# Patient Record
Sex: Male | Born: 1955 | Marital: Married | Smoking: Former smoker
Health system: Southern US, Community
[De-identification: ages and names within clinical notes are randomized; demographics above are authoritative.]

## PROBLEM LIST (undated history)

## (undated) DIAGNOSIS — E785 Hyperlipidemia, unspecified: Secondary | ICD-10-CM

## (undated) HISTORY — DX: Hyperlipidemia, unspecified: E78.5

---

## 2012-10-20 ENCOUNTER — Ambulatory Visit (INDEPENDENT_AMBULATORY_CARE_PROVIDER_SITE_OTHER): Payer: Managed Care, Other (non HMO) | Admitting: Internal Medicine

## 2012-10-20 ENCOUNTER — Encounter: Payer: Self-pay | Admitting: Internal Medicine

## 2012-10-20 VITALS — BP 130/90 | HR 74 | Temp 98.2°F | Ht 64.5 in | Wt 151.0 lb

## 2012-10-20 DIAGNOSIS — L509 Urticaria, unspecified: Secondary | ICD-10-CM

## 2012-10-20 DIAGNOSIS — S42309S Unspecified fracture of shaft of humerus, unspecified arm, sequela: Secondary | ICD-10-CM

## 2012-10-20 DIAGNOSIS — S2241XS Multiple fractures of ribs, right side, sequela: Secondary | ICD-10-CM

## 2012-10-20 DIAGNOSIS — IMO0002 Reserved for concepts with insufficient information to code with codable children: Secondary | ICD-10-CM

## 2012-10-20 DIAGNOSIS — S42109A Fracture of unspecified part of scapula, unspecified shoulder, initial encounter for closed fracture: Secondary | ICD-10-CM | POA: Insufficient documentation

## 2012-10-20 DIAGNOSIS — Z0000 Encounter for general adult medical examination without abnormal findings: Secondary | ICD-10-CM

## 2012-10-20 DIAGNOSIS — S2249XA Multiple fractures of ribs, unspecified side, initial encounter for closed fracture: Secondary | ICD-10-CM | POA: Insufficient documentation

## 2012-10-20 DIAGNOSIS — S42101S Fracture of unspecified part of scapula, right shoulder, sequela: Secondary | ICD-10-CM

## 2012-10-20 MED ORDER — KETOCONAZOLE 2 % EX CREA: TOPICAL_CREAM | Freq: Two times a day (BID) | CUTANEOUS | Status: DC

## 2012-10-20 MED ORDER — VITAMIN D 1000 UNITS PO TABS: 1000.0000 [IU] | ORAL_TABLET | Freq: Every day | ORAL | Status: AC

## 2012-10-20 NOTE — Assessment & Plan Note (Signed)
9/14 R x6 ribs Continue with current prescription therapy as reflected on the Med list.

## 2012-10-20 NOTE — Progress Notes (Signed)
  Subjective:    Patient ID: Manuel Hendrix, male    DOB: Sep 03, 1955, 57 y.o.   MRN: [id number]  HPI  New pt Had a fall 3 wks ago (off a 7 ft tall retention wall) in [place] - 6 rib fx and scapula fx. F/u w/Dr Supple - no need for surgery. C/o R CP worse at night.  Review of Systems  Constitutional: Negative for appetite change, fatigue and unexpected weight change.  HENT: Negative for ear pain, nosebleeds, congestion, sore throat, sneezing, trouble swallowing, neck pain and dental problem.   Eyes: Negative for itching and visual disturbance.  Respiratory: Negative for cough, chest tightness, wheezing and stridor.   Cardiovascular: Positive for chest pain. Negative for palpitations and leg swelling.  Gastrointestinal: Negative for nausea, vomiting, diarrhea, blood in stool and abdominal distention.  Genitourinary: Negative for urgency, frequency, hematuria, decreased urine volume and enuresis.  Musculoskeletal: Negative for back pain, joint swelling and gait problem.  Skin: Negative for rash.  Neurological: Negative for dizziness, tremors, speech difficulty, weakness, light-headedness and numbness.  Psychiatric/Behavioral: Negative for suicidal ideas, behavioral problems, confusion, sleep disturbance, self-injury, dysphoric mood and agitation. The patient is not nervous/anxious.        Objective:   Physical Exam  Constitutional: He is oriented to person, place, and time. He appears well-developed. No distress.  NAD  HENT:  Mouth/Throat: Oropharynx is clear and moist.  Eyes: Conjunctivae are normal. Pupils are equal, round, and reactive to light.  Neck: Normal range of motion. No JVD present. No thyromegaly present.  Cardiovascular: Normal rate, regular rhythm, normal heart sounds and intact distal pulses.  Exam reveals no gallop and no friction rub.   No murmur heard. R chest wall is tender  Pulmonary/Chest: Effort normal and breath sounds normal. No respiratory distress. He has  no wheezes. He has no rales. He exhibits no tenderness.  Abdominal: Soft. Bowel sounds are normal. He exhibits no distension and no mass. There is no tenderness. There is no rebound and no guarding.  Musculoskeletal: Normal range of motion. He exhibits no edema and no tenderness.  Lymphadenopathy:    He has no cervical adenopathy.  Neurological: He is alert and oriented to person, place, and time. He has normal reflexes. No cranial nerve deficit. He exhibits normal muscle tone. He displays a negative Romberg sign. Coordination and gait normal.  Skin: Skin is warm and dry. No rash noted.  Psychiatric: He has a normal mood and affect. His behavior is normal. Judgment and thought content normal.          Assessment & Plan:

## 2012-10-20 NOTE — Assessment & Plan Note (Signed)
Claritin qd Hydrocort cream Call if not resolved

## 2012-10-20 NOTE — Assessment & Plan Note (Signed)
9/14 R Dr Rennis Chris No need for surgery

## 2012-12-14 ENCOUNTER — Encounter: Payer: Managed Care, Other (non HMO) | Admitting: Internal Medicine

## 2013-01-04 ENCOUNTER — Telehealth: Payer: Self-pay | Admitting: Internal Medicine

## 2013-01-04 NOTE — Telephone Encounter (Signed)
Pt left. Apparently his wife is a pt and he had asked Dr Rhea Belton to draw labs for him.

## 2013-01-07 ENCOUNTER — Telehealth: Payer: Self-pay | Admitting: Internal Medicine

## 2013-01-07 DIAGNOSIS — Z8619 Personal history of other infectious and parasitic diseases: Secondary | ICD-10-CM

## 2013-01-07 NOTE — Telephone Encounter (Signed)
Dr Dorena Bodo, this pt has called before. You see his wife and he states you told him you would draw labs. Please advise. Thanks.

## 2013-01-10 NOTE — Telephone Encounter (Signed)
H pylori stool Ag.

## 2013-01-11 NOTE — Telephone Encounter (Signed)
Advised patient in Spanish that lab orders were put in.

## 2013-01-11 NOTE — Telephone Encounter (Signed)
Ordered test and one of our staff will inform pt who speaks Spanish.

## 2013-02-04 ENCOUNTER — Other Ambulatory Visit: Payer: PRIVATE HEALTH INSURANCE

## 2013-02-04 DIAGNOSIS — Z8619 Personal history of other infectious and parasitic diseases: Secondary | ICD-10-CM

## 2013-02-05 LAB — HELICOBACTER PYLORI  SPECIAL ANTIGEN: H. PYLORI Antigen: NEGATIVE

## 2013-02-28 ENCOUNTER — Ambulatory Visit: Payer: PRIVATE HEALTH INSURANCE | Admitting: Psychology

## 2013-03-04 ENCOUNTER — Ambulatory Visit: Payer: PRIVATE HEALTH INSURANCE | Admitting: Psychology

## 2013-03-09 ENCOUNTER — Ambulatory Visit (INDEPENDENT_AMBULATORY_CARE_PROVIDER_SITE_OTHER): Payer: Managed Care, Other (non HMO) | Admitting: Psychology

## 2013-03-09 DIAGNOSIS — F4323 Adjustment disorder with mixed anxiety and depressed mood: Secondary | ICD-10-CM

## 2013-03-16 ENCOUNTER — Ambulatory Visit (INDEPENDENT_AMBULATORY_CARE_PROVIDER_SITE_OTHER): Payer: Managed Care, Other (non HMO) | Admitting: Psychology

## 2013-03-16 DIAGNOSIS — F4323 Adjustment disorder with mixed anxiety and depressed mood: Secondary | ICD-10-CM

## 2013-03-23 ENCOUNTER — Ambulatory Visit (INDEPENDENT_AMBULATORY_CARE_PROVIDER_SITE_OTHER): Payer: Managed Care, Other (non HMO) | Admitting: Psychology

## 2013-03-23 DIAGNOSIS — F4323 Adjustment disorder with mixed anxiety and depressed mood: Secondary | ICD-10-CM

## 2013-03-30 ENCOUNTER — Ambulatory Visit (INDEPENDENT_AMBULATORY_CARE_PROVIDER_SITE_OTHER): Payer: Managed Care, Other (non HMO) | Admitting: Psychology

## 2013-03-30 ENCOUNTER — Ambulatory Visit: Payer: Managed Care, Other (non HMO) | Admitting: Psychology

## 2013-03-30 DIAGNOSIS — F4323 Adjustment disorder with mixed anxiety and depressed mood: Secondary | ICD-10-CM

## 2013-04-11 ENCOUNTER — Ambulatory Visit (INDEPENDENT_AMBULATORY_CARE_PROVIDER_SITE_OTHER): Payer: Managed Care, Other (non HMO) | Admitting: Psychology

## 2013-04-11 DIAGNOSIS — F4323 Adjustment disorder with mixed anxiety and depressed mood: Secondary | ICD-10-CM

## 2013-05-04 ENCOUNTER — Ambulatory Visit: Payer: PRIVATE HEALTH INSURANCE | Admitting: Psychology

## 2013-05-13 ENCOUNTER — Ambulatory Visit: Payer: PRIVATE HEALTH INSURANCE | Admitting: Psychology

## 2013-05-16 ENCOUNTER — Ambulatory Visit (INDEPENDENT_AMBULATORY_CARE_PROVIDER_SITE_OTHER): Payer: Managed Care, Other (non HMO) | Admitting: Psychology

## 2013-05-16 DIAGNOSIS — F4323 Adjustment disorder with mixed anxiety and depressed mood: Secondary | ICD-10-CM

## 2013-05-30 ENCOUNTER — Ambulatory Visit (INDEPENDENT_AMBULATORY_CARE_PROVIDER_SITE_OTHER): Payer: PRIVATE HEALTH INSURANCE | Admitting: Psychology

## 2013-05-30 DIAGNOSIS — F4323 Adjustment disorder with mixed anxiety and depressed mood: Secondary | ICD-10-CM

## 2013-06-22 ENCOUNTER — Ambulatory Visit (INDEPENDENT_AMBULATORY_CARE_PROVIDER_SITE_OTHER): Payer: PRIVATE HEALTH INSURANCE

## 2013-06-22 ENCOUNTER — Encounter: Payer: Self-pay | Admitting: Internal Medicine

## 2013-06-22 ENCOUNTER — Ambulatory Visit (INDEPENDENT_AMBULATORY_CARE_PROVIDER_SITE_OTHER): Payer: PRIVATE HEALTH INSURANCE | Admitting: Internal Medicine

## 2013-06-22 ENCOUNTER — Ambulatory Visit (INDEPENDENT_AMBULATORY_CARE_PROVIDER_SITE_OTHER): Payer: PRIVATE HEALTH INSURANCE | Admitting: Psychology

## 2013-06-22 VITALS — BP 140/80 | HR 72 | Temp 98.3°F | Resp 16 | Wt 152.0 lb

## 2013-06-22 DIAGNOSIS — R7309 Other abnormal glucose: Secondary | ICD-10-CM

## 2013-06-22 DIAGNOSIS — Z7721 Contact with and (suspected) exposure to potentially hazardous body fluids: Secondary | ICD-10-CM

## 2013-06-22 DIAGNOSIS — R739 Hyperglycemia, unspecified: Secondary | ICD-10-CM | POA: Insufficient documentation

## 2013-06-22 DIAGNOSIS — Z0000 Encounter for general adult medical examination without abnormal findings: Secondary | ICD-10-CM

## 2013-06-22 DIAGNOSIS — F4323 Adjustment disorder with mixed anxiety and depressed mood: Secondary | ICD-10-CM

## 2013-06-22 DIAGNOSIS — N529 Male erectile dysfunction, unspecified: Secondary | ICD-10-CM

## 2013-06-22 DIAGNOSIS — E785 Hyperlipidemia, unspecified: Secondary | ICD-10-CM | POA: Insufficient documentation

## 2013-06-22 LAB — GLUCOSE, POCT (MANUAL RESULT ENTRY): POC Glucose: 97 mg/dl (ref 70–99)

## 2013-06-22 LAB — TSH: TSH: 1.92 u[IU]/mL (ref 0.35–4.50)

## 2013-06-22 LAB — LIPID PANEL
CHOL/HDL RATIO: 6
Cholesterol: 242 mg/dL — ABNORMAL HIGH (ref 0–200)
HDL: 39.7 mg/dL (ref >39.00)
LDL CALC: 137 mg/dL — AB (ref 0–99)
TRIGLYCERIDES: 328 mg/dL — AB (ref 0.0–149.0)
VLDL: 65.6 mg/dL — AB (ref 0.0–40.0)

## 2013-06-22 LAB — URINALYSIS
BILIRUBIN URINE: NEGATIVE
Hgb urine dipstick: NEGATIVE
KETONES UR: NEGATIVE
LEUKOCYTES UA: NEGATIVE
Nitrite: NEGATIVE
PH: 6 (ref 5.0–8.0)
Specific Gravity, Urine: 1.02 (ref 1.000–1.030)
Total Protein, Urine: NEGATIVE
Urine Glucose: NEGATIVE
Urobilinogen, UA: 0.2 (ref 0.0–1.0)

## 2013-06-22 LAB — CBC WITH DIFFERENTIAL/PLATELET
BASOS ABS: 0 10*3/uL (ref 0.0–0.1)
Basophils Relative: 1 % (ref 0.0–3.0)
EOS ABS: 0.2 10*3/uL (ref 0.0–0.7)
Eosinophils Relative: 5.4 % — ABNORMAL HIGH (ref 0.0–5.0)
HCT: 45.2 % (ref 39.0–52.0)
Hemoglobin: 15.8 g/dL (ref 13.0–17.0)
LYMPHS PCT: 34.8 % (ref 12.0–46.0)
Lymphs Abs: 1.4 10*3/uL (ref 0.7–4.0)
MCHC: 35 g/dL (ref 30.0–36.0)
MCV: 87.9 fl (ref 78.0–100.0)
MONOS PCT: 6.8 % (ref 3.0–12.0)
Monocytes Absolute: 0.3 10*3/uL (ref 0.1–1.0)
NEUTROS PCT: 52 % (ref 43.0–77.0)
Neutro Abs: 2.2 10*3/uL (ref 1.4–7.7)
Platelets: 192 10*3/uL (ref 150.0–400.0)
RBC: 5.14 Mil/uL (ref 4.22–5.81)
RDW: 12.4 % (ref 11.5–15.5)
WBC: 4.2 10*3/uL (ref 4.0–10.5)

## 2013-06-22 LAB — BASIC METABOLIC PANEL
BUN: 10 mg/dL (ref 6–23)
CHLORIDE: 106 meq/L (ref 96–112)
CO2: 26 mEq/L (ref 19–32)
Calcium: 9.3 mg/dL (ref 8.4–10.5)
Creatinine, Ser: 0.8 mg/dL (ref 0.4–1.5)
GFR: 109.48 mL/min (ref >60.00)
Glucose, Bld: 87 mg/dL (ref 70–99)
POTASSIUM: 3.7 meq/L (ref 3.5–5.1)
SODIUM: 139 meq/L (ref 135–145)

## 2013-06-22 LAB — HEPATIC FUNCTION PANEL
ALK PHOS: 54 U/L (ref 39–117)
ALT: 35 U/L (ref 0–53)
AST: 29 U/L (ref 0–37)
Albumin: 4.3 g/dL (ref 3.5–5.2)
BILIRUBIN DIRECT: 0.2 mg/dL (ref 0.0–0.3)
BILIRUBIN TOTAL: 1 mg/dL (ref 0.2–1.2)
Total Protein: 6.9 g/dL (ref 6.0–8.3)

## 2013-06-22 LAB — PSA: PSA: 1.2 ng/mL (ref 0.10–4.00)

## 2013-06-22 LAB — HEMOGLOBIN A1C: HEMOGLOBIN A1C: 5.7 % (ref 4.6–6.5)

## 2013-06-22 LAB — TESTOSTERONE: Testosterone: 307.96 ng/dL (ref 300.00–890.00)

## 2013-06-22 MED ORDER — OMEGA-3-ACID ETHYL ESTERS 1 G PO CAPS: 2.0000 g | ORAL_CAPSULE | Freq: Two times a day (BID) | ORAL | Status: DC

## 2013-06-22 MED ORDER — GEMFIBROZIL 600 MG PO TABS: 600.0000 mg | ORAL_TABLET | Freq: Every day | ORAL | Status: DC

## 2013-06-22 MED ORDER — SILDENAFIL CITRATE 100 MG PO TABS: 100.0000 mg | ORAL_TABLET | ORAL | Status: DC | PRN

## 2013-06-22 NOTE — Assessment & Plan Note (Signed)
Remote  

## 2013-06-22 NOTE — Assessment & Plan Note (Signed)
On gemfibrozil and Lovaza

## 2013-06-22 NOTE — Progress Notes (Signed)
Pre visit review using our clinic review tool, if applicable. No additional management support is needed unless otherwise documented below in the visit note. 

## 2013-06-22 NOTE — Addendum Note (Signed)
Addended by: Tresa Garter on: 06/22/2013 11:37 AM   Modules accepted: Orders

## 2013-06-22 NOTE — Assessment & Plan Note (Signed)
We discussed age appropriate health related issues, including available/recomended screening tests and vaccinations. We discussed a need for adhering to healthy diet and exercise. Labs/EKG were reviewed/ordered. All questions were answered.   

## 2013-06-22 NOTE — Assessment & Plan Note (Addendum)
Labs Discussed safe sex They are in counseling

## 2013-06-22 NOTE — Progress Notes (Signed)
  Subjective:    HPI  The patient is here for a wellness exam. The patient has been doing well overall without major physical or psychological issues going on lately. C/o ED x2-3 y Had a fall in 2014 (off a 7 ft tall retention wall) in [place] - 6 rib fx and scapula fx. F/u w/Dr Supple - no need for surgery.   Review of Systems  Constitutional: Negative for appetite change, fatigue and unexpected weight change.  HENT: Negative for congestion, dental problem, ear pain, nosebleeds, sneezing, sore throat and trouble swallowing.   Eyes: Negative for itching and visual disturbance.  Respiratory: Negative for cough, chest tightness, wheezing and stridor.   Cardiovascular: Positive for chest pain. Negative for palpitations and leg swelling.  Gastrointestinal: Negative for nausea, vomiting, diarrhea, blood in stool and abdominal distention.  Genitourinary: Negative for urgency, frequency, hematuria, decreased urine volume and enuresis.  Musculoskeletal: Negative for back pain, gait problem, joint swelling and neck pain.  Skin: Negative for rash.  Neurological: Negative for dizziness, tremors, speech difficulty, weakness, light-headedness and numbness.  Psychiatric/Behavioral: Negative for suicidal ideas, behavioral problems, confusion, sleep disturbance, self-injury, dysphoric mood and agitation. The patient is not nervous/anxious.        Objective:   Physical Exam  Constitutional: He is oriented to person, place, and time. He appears well-developed. No distress.  NAD  HENT:  Mouth/Throat: Oropharynx is clear and moist.  Eyes: Conjunctivae are normal. Pupils are equal, round, and reactive to light.  Neck: Normal range of motion. No JVD present. No thyromegaly present.  Cardiovascular: Normal rate, regular rhythm, normal heart sounds and intact distal pulses.  Exam reveals no gallop and no friction rub.   No murmur heard. R chest wall is tender  Pulmonary/Chest: Effort normal and breath  sounds normal. No respiratory distress. He has no wheezes. He has no rales. He exhibits no tenderness.  Abdominal: Soft. Bowel sounds are normal. He exhibits no distension and no mass. There is no tenderness. There is no rebound and no guarding.  Musculoskeletal: Normal range of motion. He exhibits no edema and no tenderness.  Lymphadenopathy:    He has no cervical adenopathy.  Neurological: He is alert and oriented to person, place, and time. He has normal reflexes. No cranial nerve deficit. He exhibits normal muscle tone. He displays a negative Romberg sign. Coordination and gait normal.  Skin: Skin is warm and dry. No rash noted.  Psychiatric: He has a normal mood and affect. His behavior is normal. Judgment and thought content normal.          Assessment & Plan:

## 2013-06-23 LAB — HIV ANTIBODY (ROUTINE TESTING W REFLEX): HIV 1&2 Ab, 4th Generation: NONREACTIVE

## 2013-06-23 LAB — HEPATITIS B SURFACE ANTIGEN: HEP B S AG: NEGATIVE

## 2013-06-23 LAB — HEPATITIS B SURFACE ANTIBODY,QUALITATIVE: Hep B S Ab: POSITIVE — AB

## 2013-06-23 LAB — HEPATITIS C ANTIBODY: HCV Ab: NEGATIVE

## 2013-07-25 ENCOUNTER — Encounter: Payer: Self-pay | Admitting: Internal Medicine

## 2013-08-01 ENCOUNTER — Ambulatory Visit: Payer: PRIVATE HEALTH INSURANCE | Admitting: Psychology

## 2013-08-23 ENCOUNTER — Ambulatory Visit: Payer: PRIVATE HEALTH INSURANCE | Admitting: Cardiology

## 2013-08-26 ENCOUNTER — Ambulatory Visit: Payer: PRIVATE HEALTH INSURANCE | Admitting: Licensed Clinical Social Worker

## 2013-09-02 ENCOUNTER — Ambulatory Visit (INDEPENDENT_AMBULATORY_CARE_PROVIDER_SITE_OTHER): Payer: PRIVATE HEALTH INSURANCE | Admitting: Licensed Clinical Social Worker

## 2013-09-02 DIAGNOSIS — F4323 Adjustment disorder with mixed anxiety and depressed mood: Secondary | ICD-10-CM

## 2013-09-20 ENCOUNTER — Ambulatory Visit: Payer: PRIVATE HEALTH INSURANCE | Admitting: Cardiology

## 2013-09-22 ENCOUNTER — Ambulatory Visit: Payer: PRIVATE HEALTH INSURANCE | Admitting: Internal Medicine

## 2013-09-23 ENCOUNTER — Ambulatory Visit: Payer: PRIVATE HEALTH INSURANCE | Admitting: Licensed Clinical Social Worker

## 2013-10-04 ENCOUNTER — Ambulatory Visit (INDEPENDENT_AMBULATORY_CARE_PROVIDER_SITE_OTHER): Payer: PRIVATE HEALTH INSURANCE | Admitting: Licensed Clinical Social Worker

## 2013-10-04 DIAGNOSIS — F4323 Adjustment disorder with mixed anxiety and depressed mood: Secondary | ICD-10-CM

## 2013-10-14 ENCOUNTER — Encounter: Payer: Self-pay | Admitting: Internal Medicine

## 2013-10-14 ENCOUNTER — Ambulatory Visit (INDEPENDENT_AMBULATORY_CARE_PROVIDER_SITE_OTHER): Payer: PRIVATE HEALTH INSURANCE | Admitting: Internal Medicine

## 2013-10-14 VITALS — BP 128/78 | HR 80 | Temp 98.6°F | Resp 16 | Wt 154.4 lb

## 2013-10-14 DIAGNOSIS — H109 Unspecified conjunctivitis: Secondary | ICD-10-CM

## 2013-10-14 MED ORDER — POLYMYXIN B-TRIMETHOPRIM 10000-0.1 UNIT/ML-% OP SOLN: 2.0000 [drp] | Freq: Four times a day (QID) | OPHTHALMIC | Status: DC

## 2013-10-14 NOTE — Progress Notes (Signed)
   Subjective:    Patient ID: Manuel Hendrix, male    DOB: May 21, 1955, 58 y.o.   MRN: [id number]  HPI The patient is a 58 YO man who is coming in today for eye irritation. He first had it when traveling to [facility] clinic to install radiation equipment for cancer treatments. He has not had vision disturbance. He has noticed red eyes and mild crusting. It has not spread to his other eye. He occasionally got a gritty sensation in the eye but blinking a few times and it went away. He denies fevers, chills, exposure to others with these symptoms that he knows of.   Review of Systems  Constitutional: Negative for fever, activity change, appetite change and fatigue.  HENT: Negative for congestion, ear pain, rhinorrhea and sinus pressure.   Eyes: Positive for pain, discharge, redness and itching. Negative for photophobia and visual disturbance.  Respiratory: Negative for cough, chest tightness, shortness of breath and wheezing.   Cardiovascular: Negative for chest pain, palpitations and leg swelling.      Objective:   Physical Exam  Constitutional: He appears well-developed and well-nourished.  HENT:  Head: Normocephalic and atraumatic.  Nose: Nose normal.  Mouth/Throat: Oropharynx is clear and moist.  Eyes:  Left eye with redness in the conjunctiva and mild crusting at the rim. Right eye normal with no conjunctival changes.   Cardiovascular: Normal rate and regular rhythm.   Pulmonary/Chest: Effort normal and breath sounds normal.      Assessment & Plan:

## 2013-10-14 NOTE — Patient Instructions (Signed)
We have sent in the eye drops that you will use. Put 2 drops in your eye 4 times per day for 6 days.   You can use saline or salt eye drops to help your eyes stay moist if you have a gritty sensation in your eye.  If you have change in your vision or loss of vision seek medical help immediately.   Bacterial Conjunctivitis Bacterial conjunctivitis, commonly called pink eye, is an inflammation of the clear membrane that covers the white part of the eye (conjunctiva). The inflammation can also happen on the underside of the eyelids. The blood vessels in the conjunctiva become inflamed, causing the eye to become red or pink. Bacterial conjunctivitis may spread easily from one eye to another and from person to person (contagious).  CAUSES  Bacterial conjunctivitis is caused by bacteria. The bacteria may come from your own skin, your upper respiratory tract, or from someone else with bacterial conjunctivitis. SYMPTOMS  The normally white color of the eye or the underside of the eyelid is usually pink or red. The pink eye is usually associated with irritation, tearing, and some sensitivity to light. Bacterial conjunctivitis is often associated with a thick, yellowish discharge from the eye. The discharge may turn into a crust on the eyelids overnight, which causes your eyelids to stick together. If a discharge is present, there may also be some blurred vision in the affected eye. DIAGNOSIS  Bacterial conjunctivitis is diagnosed by your caregiver through an eye exam and the symptoms that you report. Your caregiver looks for changes in the surface tissues of your eyes, which may point to the specific type of conjunctivitis. A sample of any discharge may be collected on a cotton-tip swab if you have a severe case of conjunctivitis, if your cornea is affected, or if you keep getting repeat infections that do not respond to treatment. The sample will be sent to a lab to see if the inflammation is caused by a  bacterial infection and to see if the infection will respond to antibiotic medicines. TREATMENT   Bacterial conjunctivitis is treated with antibiotics. Antibiotic eyedrops are most often used. However, antibiotic ointments are also available. Antibiotics pills are sometimes used. Artificial tears or eye washes may ease discomfort. HOME CARE INSTRUCTIONS   To ease discomfort, apply a cool, clean washcloth to your eye for 10-20 minutes, 3-4 times a day.  Gently wipe away any drainage from your eye with a warm, wet washcloth or a cotton ball.  Wash your hands often with soap and water. Use paper towels to dry your hands.  Do not share towels or washcloths. This may spread the infection.  Change or wash your pillowcase every day.  You should not use eye makeup until the infection is gone.  Do not operate machinery or drive if your vision is blurred.  Stop using contact lenses. Ask your caregiver how to sterilize or replace your contacts before using them again. This depends on the type of contact lenses that you use.  When applying medicine to the infected eye, do not touch the edge of your eyelid with the eyedrop bottle or ointment tube. SEEK IMMEDIATE MEDICAL CARE IF:   Your infection has not improved within 3 days after beginning treatment.  You had yellow discharge from your eye and it returns.  You have increased eye pain.  Your eye redness is spreading.  Your vision becomes blurred.  You have a fever or persistent symptoms for more than 2-3 days.  You have a fever and your symptoms suddenly get worse.  You have facial pain, redness, or swelling. MAKE SURE YOU:   Understand these instructions.  Will watch your condition.  Will get help right away if you are not doing well or get worse. Document Released: 01/13/2005 Document Revised: 05/30/2013 Document Reviewed: 06/16/2011 [facility] Patient Information 2015 [place], [place]. This information is not intended to replace  advice given to you by your health care provider. Make sure you discuss any questions you have with your health care provider.

## 2013-10-14 NOTE — Progress Notes (Signed)
Pre visit review using our clinic review tool, if applicable. No additional management support is needed unless otherwise documented below in the visit note. 

## 2013-10-14 NOTE — Assessment & Plan Note (Signed)
Likely bacterial conjunctivitis in the left eye and rx for trimethoprim/polymyxin drops to be taken 2 drops QID for 5-6 days. If any grittiness he can use saline or salt eye drops for moisture. If he has any decrease or change in vision he will seek immediate medical attention.

## 2013-10-31 ENCOUNTER — Ambulatory Visit (INDEPENDENT_AMBULATORY_CARE_PROVIDER_SITE_OTHER): Payer: PRIVATE HEALTH INSURANCE | Admitting: Licensed Clinical Social Worker

## 2013-10-31 DIAGNOSIS — F4323 Adjustment disorder with mixed anxiety and depressed mood: Secondary | ICD-10-CM

## 2013-11-14 ENCOUNTER — Ambulatory Visit: Payer: PRIVATE HEALTH INSURANCE | Admitting: Cardiology

## 2013-11-21 ENCOUNTER — Ambulatory Visit: Payer: PRIVATE HEALTH INSURANCE | Admitting: Licensed Clinical Social Worker

## 2014-03-07 ENCOUNTER — Telehealth: Payer: Self-pay | Admitting: Internal Medicine

## 2014-03-07 ENCOUNTER — Ambulatory Visit (INDEPENDENT_AMBULATORY_CARE_PROVIDER_SITE_OTHER): Payer: PRIVATE HEALTH INSURANCE | Admitting: Internal Medicine

## 2014-03-07 ENCOUNTER — Encounter: Payer: Self-pay | Admitting: Internal Medicine

## 2014-03-07 ENCOUNTER — Other Ambulatory Visit (INDEPENDENT_AMBULATORY_CARE_PROVIDER_SITE_OTHER): Payer: PRIVATE HEALTH INSURANCE

## 2014-03-07 VITALS — BP 140/96 | HR 69 | Temp 98.0°F | Resp 16 | Ht 64.5 in | Wt 157.1 lb

## 2014-03-07 DIAGNOSIS — N529 Male erectile dysfunction, unspecified: Secondary | ICD-10-CM

## 2014-03-07 DIAGNOSIS — L509 Urticaria, unspecified: Secondary | ICD-10-CM

## 2014-03-07 DIAGNOSIS — R21 Rash and other nonspecific skin eruption: Secondary | ICD-10-CM

## 2014-03-07 LAB — CBC
HCT: 48.2 % (ref 39.0–52.0)
Hemoglobin: 17 g/dL (ref 13.0–17.0)
MCHC: 35.2 g/dL (ref 30.0–36.0)
MCV: 85.3 fl (ref 78.0–100.0)
PLATELETS: 218 10*3/uL (ref 150.0–400.0)
RBC: 5.65 Mil/uL (ref 4.22–5.81)
RDW: 12.6 % (ref 11.5–15.5)
WBC: 3.9 10*3/uL — AB (ref 4.0–10.5)

## 2014-03-07 LAB — HEMOGLOBIN A1C: Hgb A1c MFr Bld: 6 % (ref 4.6–6.5)

## 2014-03-07 MED ORDER — TRIAMCINOLONE ACETONIDE 0.1 % EX CREA: 1.0000 "application " | TOPICAL_CREAM | Freq: Two times a day (BID) | CUTANEOUS | Status: DC

## 2014-03-07 NOTE — Assessment & Plan Note (Signed)
Wants referral to urology, going on for many years, sporadic.

## 2014-03-07 NOTE — Telephone Encounter (Signed)
OK w/me Thx 

## 2014-03-07 NOTE — Assessment & Plan Note (Signed)
No known allergen, triamcinolone cream and refer to dermatology. Will check HgA1c per patient request.

## 2014-03-07 NOTE — Progress Notes (Signed)
   Subjective:    Patient ID: Manuel Hendrix, male    DOB: 29-Dec-1955, 59 y.o.   MRN: [id number]  HPI The patient is a 59 YO man who is coming in with his wife today for some bumps that are itchy on his skin. They have been coming and going for the last month. He has associated itching, denies fevers or chills. He is concerned about diabetes as this is how his mother found out she had diabetes was skin rash like this. He denies fevers, chills, weight loss. The itching is about 3-4/10 in distraction him from his activities. The bumps do not fill with fluid or drain out pus and do not scar when they disappear. He denies associated allergens that are new. He does travel a lot but his wife also travels with him and she is not having any problems. No pain associated with the bumps. Previous episode in 2014 of itching and bumps which were never identified.   Review of Systems  Constitutional: Negative for fever, activity change, appetite change, fatigue and unexpected weight change.  HENT: Negative.   Respiratory: Negative for cough, chest tightness, shortness of breath and wheezing.   Cardiovascular: Negative for chest pain, palpitations and leg swelling.  Skin: Positive for rash. Negative for color change and wound.  Psychiatric/Behavioral: Negative.       Objective:   Physical Exam  Constitutional: He is oriented to person, place, and time. He appears well-developed and well-nourished.  HENT:  Head: Normocephalic and atraumatic.  Eyes: EOM are normal.  Neck: Normal range of motion.  Cardiovascular: Normal rate and regular rhythm.   Pulmonary/Chest: Effort normal and breath sounds normal. No respiratory distress. He has no wheezes.  Abdominal: Soft.  Musculoskeletal: He exhibits no edema.  Neurological: He is alert and oriented to person, place, and time.  Skin: Skin is warm and dry.  Punctate red bumps, not filled with fluid on the left upper back, left stomach and chest, right stomach along  the pants line. No associated rash or redness around the bumps.    Filed Vitals:   03/07/14 0814  BP: 140/96  Pulse: 69  Temp: 98 F (36.7 C)  TempSrc: Oral  Resp: 16  Height: 5' 4.5" (1.638 m)  Weight: 157 lb 1.9 oz (71.269 kg)  SpO2: 94%      Assessment & Plan:

## 2014-03-07 NOTE — Telephone Encounter (Signed)
Pt could like to switch to Dr Dorise HissKollar for PCP is this ok?

## 2014-03-07 NOTE — Patient Instructions (Addendum)
We will send you to the dermatologist to see if they can help decide what is causing the bumps. In the meantime I have sent in some triamcinolone cream that should help them to go away.  We will also send you to the urologist to see if they can help.   Rash A rash is a change in the color or texture of your skin. There are many different types of rashes. You may have other problems that accompany your rash. CAUSES   Infections.  Allergic reactions. This can include allergies to pets or foods.  Certain medicines.  Exposure to certain chemicals, soaps, or cosmetics.  Heat.  Exposure to poisonous plants.  Tumors, both cancerous and noncancerous. SYMPTOMS   Redness.  Scaly skin.  Itchy skin.  Dry or cracked skin.  Bumps.  Blisters.  Pain. DIAGNOSIS  Your caregiver may do a physical exam to determine what type of rash you have. A skin sample (biopsy) may be taken and examined under a microscope. TREATMENT  Treatment depends on the type of rash you have. Your caregiver may prescribe certain medicines. For serious conditions, you may need to see a skin doctor (dermatologist). HOME CARE INSTRUCTIONS   Avoid the substance that caused your rash.  Do not scratch your rash. This can cause infection.  You may take cool baths to help stop itching.  Only take over-the-counter or prescription medicines as directed by your caregiver.  Keep all follow-up appointments as directed by your caregiver. SEEK IMMEDIATE MEDICAL CARE IF:  You have increasing pain, swelling, or redness.  You have a fever.  You have new or severe symptoms.  You have body aches, diarrhea, or vomiting.  Your rash is not better after 3 days. MAKE SURE YOU:  Understand these instructions.  Will watch your condition.  Will get help right away if you are not doing well or get worse. Document Released: 01/03/2002 Document Revised: 04/07/2011 Document Reviewed: 10/28/2010 [facility] Patient  Information 2015 [place], [place]. This information is not intended to replace advice given to you by your health care provider. Make sure you discuss any questions you have with your health care provider.

## 2014-03-07 NOTE — Progress Notes (Signed)
Pre visit review using our clinic review tool, if applicable. No additional management support is needed unless otherwise documented below in the visit note. 

## 2014-03-21 ENCOUNTER — Encounter: Payer: Self-pay | Admitting: Internal Medicine

## 2014-06-27 ENCOUNTER — Encounter: Payer: Self-pay | Admitting: Internal Medicine

## 2014-06-27 ENCOUNTER — Ambulatory Visit (INDEPENDENT_AMBULATORY_CARE_PROVIDER_SITE_OTHER)
Admission: RE | Admit: 2014-06-27 | Discharge: 2014-06-27 | Disposition: A | Discharge status: Home / Self Care | Payer: PRIVATE HEALTH INSURANCE | Source: Ambulatory Visit | Attending: Internal Medicine | Admitting: Internal Medicine

## 2014-06-27 ENCOUNTER — Ambulatory Visit (INDEPENDENT_AMBULATORY_CARE_PROVIDER_SITE_OTHER): Payer: PRIVATE HEALTH INSURANCE | Admitting: Internal Medicine

## 2014-06-27 VITALS — BP 122/78 | HR 68 | Temp 98.0°F | Resp 18 | Wt 160.0 lb

## 2014-06-27 DIAGNOSIS — Z8619 Personal history of other infectious and parasitic diseases: Secondary | ICD-10-CM

## 2014-06-27 DIAGNOSIS — J209 Acute bronchitis, unspecified: Secondary | ICD-10-CM

## 2014-06-27 DIAGNOSIS — E782 Mixed hyperlipidemia: Secondary | ICD-10-CM

## 2014-06-27 MED ORDER — BENZONATATE 200 MG PO CAPS: 200.0000 mg | ORAL_CAPSULE | Freq: Three times a day (TID) | ORAL | Status: AC | PRN

## 2014-06-27 MED ORDER — AZITHROMYCIN 250 MG PO TABS: ORAL_TABLET | ORAL | Status: AC

## 2014-06-27 NOTE — Progress Notes (Signed)
Pre visit review using our clinic review tool, if applicable. No additional management support is needed unless otherwise documented below in the visit note. 

## 2014-06-27 NOTE — Patient Instructions (Signed)
  Your next office appointment will be determined based upon review of your pending  xrays Those written interpretation of the lab results and instructions will be transmitted to you by mail for your records. Critical results will be called. Followup as needed for any active or acute issue. Please report any significant change in your symptoms.  The most common cause of elevated triglycerides (TG) is the ingestion of sugar from high fructose corn syrup sources added to processed foods & drinks.  Eat a low-fat diet with lots of fruits and vegetables, up to 7-9 servings per day. Consume less than 40 Grams (preferably ZERO) of sugar per day from foods & drinks with High Fructose Corn Syrup (HFCS) sugar as #1,2,3 or # 4 on label.Whole Foods, [employer] & [employer] do not carry products with HFCS. Cardiovascular exercise, this can be as simple a program as walking, is recommended 30-45 minutes 3-4 times per week. If you're not exercising you should take 6-8 weeks to build up to this level. Fasting lipids after 3-4 months of nutrition & exercise changes.

## 2014-06-27 NOTE — Progress Notes (Signed)
   Subjective:    Patient ID: Manuel Hendrix, male    DOB: 1955/11/24, 59 y.o.   MRN: [id number]  HPI He has had a cough for approximately 2 weeks; it is essentially nonproductive except for scant thin sputum at times. He has been using Mucinex with partial response.  He has no other lower respiratory tract or upper respiratory tract symptoms. He also denies any extrinsic symptoms or constitutional symptoms.  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 An incidental finding on chest x-ray was a "spot". Imaging included PET scan. Percutaneous biopsy revealed histo.  He has not smoked since high school.  He is allergic to codeine products.   He is not on gemfibrozil as prescribed. He states his diet is balanced. He does not exercise. His last lipids were 06/22/13: Triglycerides 328; HDL 39.7; LDL 137.   Review of Systems  Extrinsic symptoms of itchy, watery eyes, sneezing, or angioedema are denied. There is no dyspnea, wheezing,or  paroxysmal nocturnal dyspnea. Frontal headache, facial pain , nasal purulence, dental pain, sore throat , otic pain or otic discharge denied. No fever , chills or sweats.     Objective:   Physical Exam General appearance:Adequately nourished; no acute distress or increased work of breathing is present.    Lymphatic: No  lymphadenopathy about the head, neck, or axilla .  Eyes: No conjunctival inflammation or lid edema is present. There is no scleral icterus.  Ears:  External ear exam shows no significant lesions or deformities.  Otoscopic examination reveals clear canals, tympanic membranes are intact bilaterally without bulging, retraction, inflammation or discharge.  Nose:  External nasal examination shows no deformity or inflammation. Nasal mucosa are pink and moist without lesions or exudates No septal dislocation or deviation.No obstruction to airflow.   Oral exam: Dental hygiene is good; lips and gums are healthy  appearing.There is no oropharyngeal erythema or exudate .  Neck:  No deformities, thyromegaly, masses, or tenderness noted.   Supple with full range of motion without pain.   Heart:  Normal rate and regular rhythm. S1 and S2 normal without gallop, murmur, click, rub or other extra sounds.   Lungs:Chest clear to auscultation; no wheezes, rhonchi,rales ,or rubs present.  Extremities:  No cyanosis, edema, or clubbing  noted    Skin: Warm & dry w/o tenting or jaundice. No significant lesions or rash.        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

## 2014-07-04 ENCOUNTER — Telehealth: Payer: Self-pay

## 2014-07-04 NOTE — Telephone Encounter (Signed)
Patient is currently working in [place], and will go to hospital in [place] to sign paper for medical records release----after release form is signed, patient will call back to our office so that we can [facility] [place], [place]---[facility] phone number is [phone]-----paper needs to be faxed to [phone] requesting pathology/radiology documentation and any related notes for lung images taken in 2011

## 2014-07-04 NOTE — Telephone Encounter (Signed)
-----   Message from Pecola LawlessWilliam F Hopper, MD sent at 06/27/2014  1:02 PM EDT ----- Please send form for release of records to hospital in [place] for records related to Histoplasmosis evaluation with the Xray report

## 2017-01-22 IMAGING — CR DG CHEST 2V
2 series · 2 of 2 positions shown · non-contrast
Comparison: None.

CLINICAL DATA: Three weeks of cough

EXAM:
CHEST  2 VIEW

[view not recorded (1 of 2)]
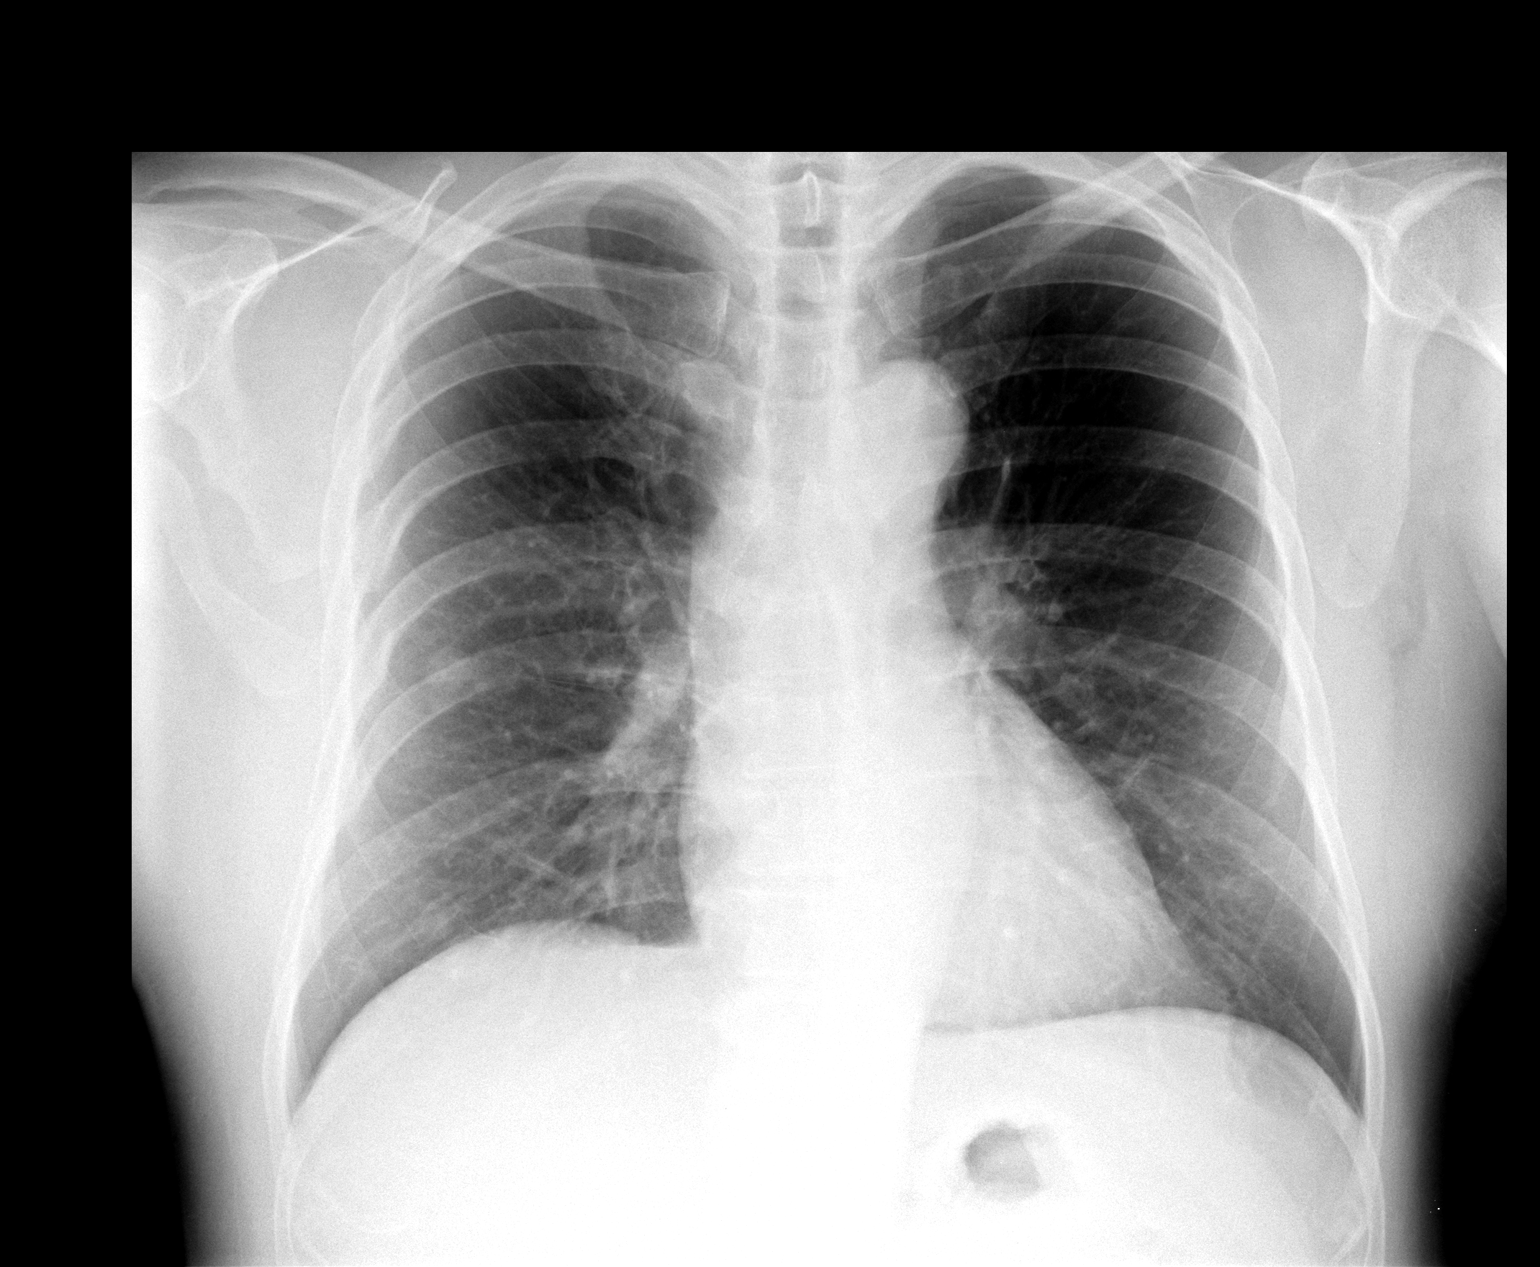

[view not recorded (2 of 2)]
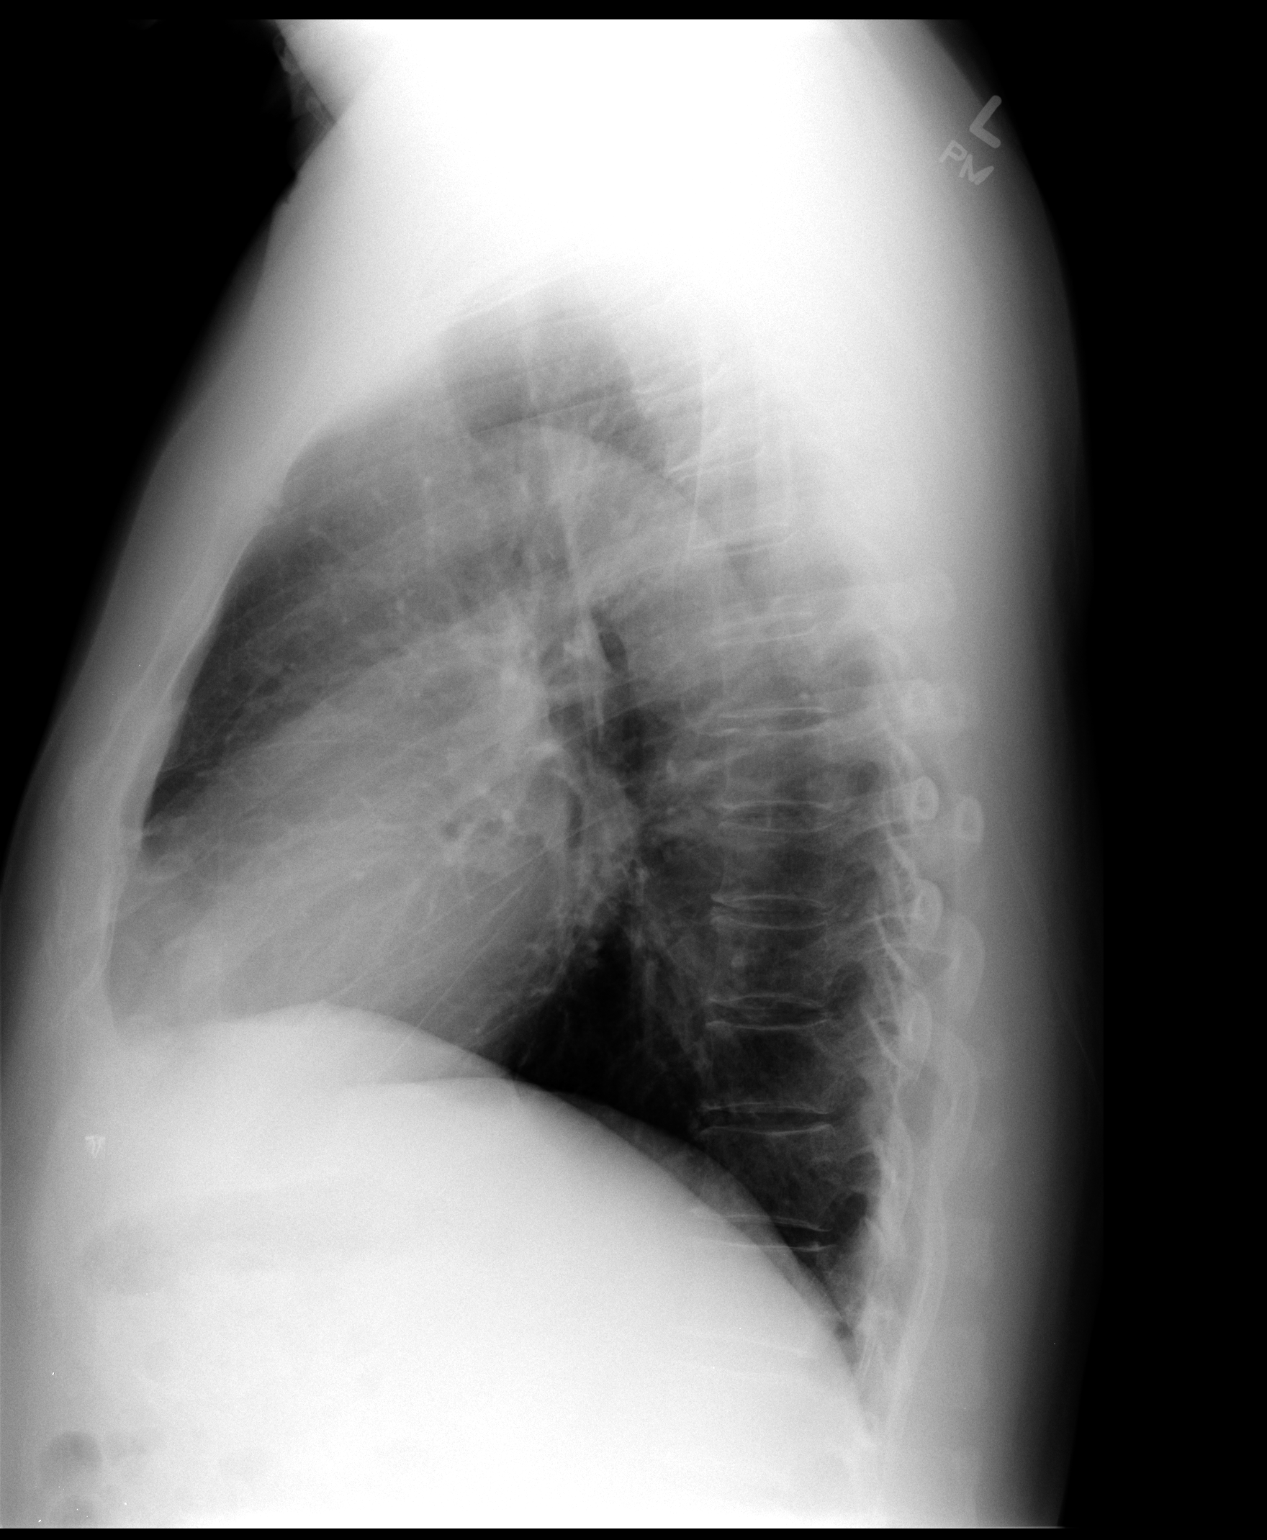

[2 of 2 positions shown; findings below may reference images not displayed]

FINDINGS: The lungs are adequately inflated. There is subtle increased density
in the right mid lung. The cardiac silhouette and pulmonary
vascularity are normal. There is no pleural effusion or
pneumothorax. There are old right-sided rib fractures and an old
right scapular fracture is visible.
IMPRESSION: 1. Subtle increased nodular density in the right mid lung the
findings may reflect infectious or neoplastic processes. Reportedly
there is a history of histoplasmosis. Given the patient's symptoms,
chest CT scanning is recommended.
2. Multiple old right lateral rib deformities as well as an ununited
fracture of the body of the right scapula.
